# Patient Record
Sex: Female | Born: 2000 | Race: White | Hispanic: No | Marital: Single | Smoking: Former smoker
Health system: Southern US, Community
[De-identification: ages and names within clinical notes are randomized; demographics above are authoritative.]

---
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
## 2020-11-03 ENCOUNTER — Encounter: Payer: Self-pay | Admitting: Plastic Surgery

## 2020-11-03 ENCOUNTER — Ambulatory Visit: Payer: BC Managed Care – PPO | Admitting: Plastic Surgery

## 2020-11-03 ENCOUNTER — Other Ambulatory Visit: Payer: Self-pay

## 2020-11-03 DIAGNOSIS — M542 Cervicalgia: Secondary | ICD-10-CM

## 2020-11-03 DIAGNOSIS — M546 Pain in thoracic spine: Secondary | ICD-10-CM

## 2020-11-03 DIAGNOSIS — M549 Dorsalgia, unspecified: Secondary | ICD-10-CM | POA: Insufficient documentation

## 2020-11-03 DIAGNOSIS — N62 Hypertrophy of breast: Secondary | ICD-10-CM

## 2020-11-03 DIAGNOSIS — G8929 Other chronic pain: Secondary | ICD-10-CM

## 2020-11-03 NOTE — Progress Notes (Signed)
Patient ID: Lori Gonzalez, female    DOB: Jan 02, 2001, 20 y.o.   MRN: [id number]   Chief Complaint  Patient presents with   Advice Only    Mammary Hyperplasia: The patient is a 20 y.o. female with a history of mammary hyperplasia for several years.  She has extremely large breasts causing symptoms that include the following: Back pain in the upper and lower back, including neck pain. She pulls or pins her bra straps to provide better lift and relief of the pressure and pain. She notices relief by holding her breast up manually.  Her shoulder straps cause grooves and pain and pressure that requires padding for relief. Pain medication is sometimes required with motrin and tylenol.  Activities that are hindered by enlarged breasts include: exercise and running.  She has tried supportive clothing as well as fitted bras without improvement.  Her breasts are extremely large and fairly symmetric.  She has hyperpigmentation of the inframammary area on both sides.  The sternal to nipple distance on the right is 26 cm and the left is 27 cm.   She is 5 feet 3 inches tall and weighs 186 pounds.  The BMI = 32.9.  Preoperative bra size = 40 DDD cup. She would like to be ~ C cup.  The estimated excess breast tissue to be removed at the time of surgery = 490 grams on the left and 490 grams on the right.  Mammogram history: none.  Family history of breast cancer:  none.  Tobacco use:  quit one year ago.   The patient expresses the desire to pursue surgical intervention.    Review of Systems  Constitutional:  Positive for activity change. Negative for appetite change.  Eyes: Negative.   Respiratory: Negative.  Negative for chest tightness and shortness of breath.   Cardiovascular: Negative.   Gastrointestinal: Negative.   Endocrine: Negative.   Genitourinary: Negative.   Musculoskeletal:  Positive for back pain and neck pain.  Skin:  Positive for rash. Negative for color change.  Hematological:  Negative.   Psychiatric/Behavioral: Negative.     History reviewed. No pertinent past medical history.  History reviewed. No pertinent surgical history.    Current Outpatient Medications:    FLUoxetine (PROZAC) 20 MG capsule, Take 20 mg by mouth daily., Disp: , Rfl:    Objective:   Vitals:   11/03/20 1536  BP: 96/69  Pulse: 93  SpO2: 98%    Physical Exam Vitals and nursing note reviewed.  Constitutional:      Appearance: Normal appearance.  HENT:     Head: Normocephalic and atraumatic.  Cardiovascular:     Rate and Rhythm: Normal rate.     Pulses: Normal pulses.  Pulmonary:     Effort: Pulmonary effort is normal.  Abdominal:     General: Abdomen is flat. There is no distension.     Tenderness: There is no abdominal tenderness.  Skin:    General: Skin is warm.     Coloration: Skin is not jaundiced.     Findings: Lesion present. No bruising or erythema.  Neurological:     General: No focal deficit present.     Mental Status: She is alert and oriented to person, place, and time.  Psychiatric:        Mood and Affect: Mood normal.        Behavior: Behavior normal.        Thought Content: Thought content normal.    Assessment & Plan:  Chronic bilateral thoracic back pain  Neck pain  Symptomatic mammary hypertrophy  The procedure the patient selected and that was best for the patient was discussed. The risk were discussed and include but not limited to the following:  Breast asymmetry, fluid accumulation, firmness of the breast, inability to breast feed, loss of nipple or areola, skin loss, change in skin and nipple sensation, fat necrosis of the breast tissue, bleeding, infection and healing delay.  There are risks of anesthesia and injury to nerves or blood vessels.  Allergic reaction to tape, suture and skin glue are possible.  There will be swelling.  Any of these can lead to the need for revisional surgery.  A breast reduction has potential to interfere with  diagnostic procedures in the future.  This procedure is best done when the breast is fully developed.  Changes in the breast will continue to occur over time: pregnancy, weight gain or weigh loss.    Total time: 40 minutes. This includes time spent with the patient during the visit as well as time spent before and after the visit reviewing the chart, documenting the encounter and ordering pertinent studies. and literature emailed to the patient.   Physical therapy:  done and requesting notes Mammogram:  not indicated  Pictures were obtained of the patient and placed in the chart with the patient's or guardian's permission.  The patient is a good candidate for bilateral breast reduction with possible lateral liposuction.  She is thinking that she would like to do this in August or September of next year.  She knows to call us 90 days before she wants to do it and then we will submit to her insurance.   Alena Bills Ronan Dion, DO

## 2021-02-08 IMAGING — DX DG ANKLE COMPLETE 3+V*R*
3 series · 3 of 3 positions shown · non-contrast
Comparison: None.

CLINICAL DATA: Right ankle pain x 6-7 weeks. NKI.

EXAM:
RIGHT ANKLE - COMPLETE 3+ VIEW

[dg ankle complete right (1 of 3)]
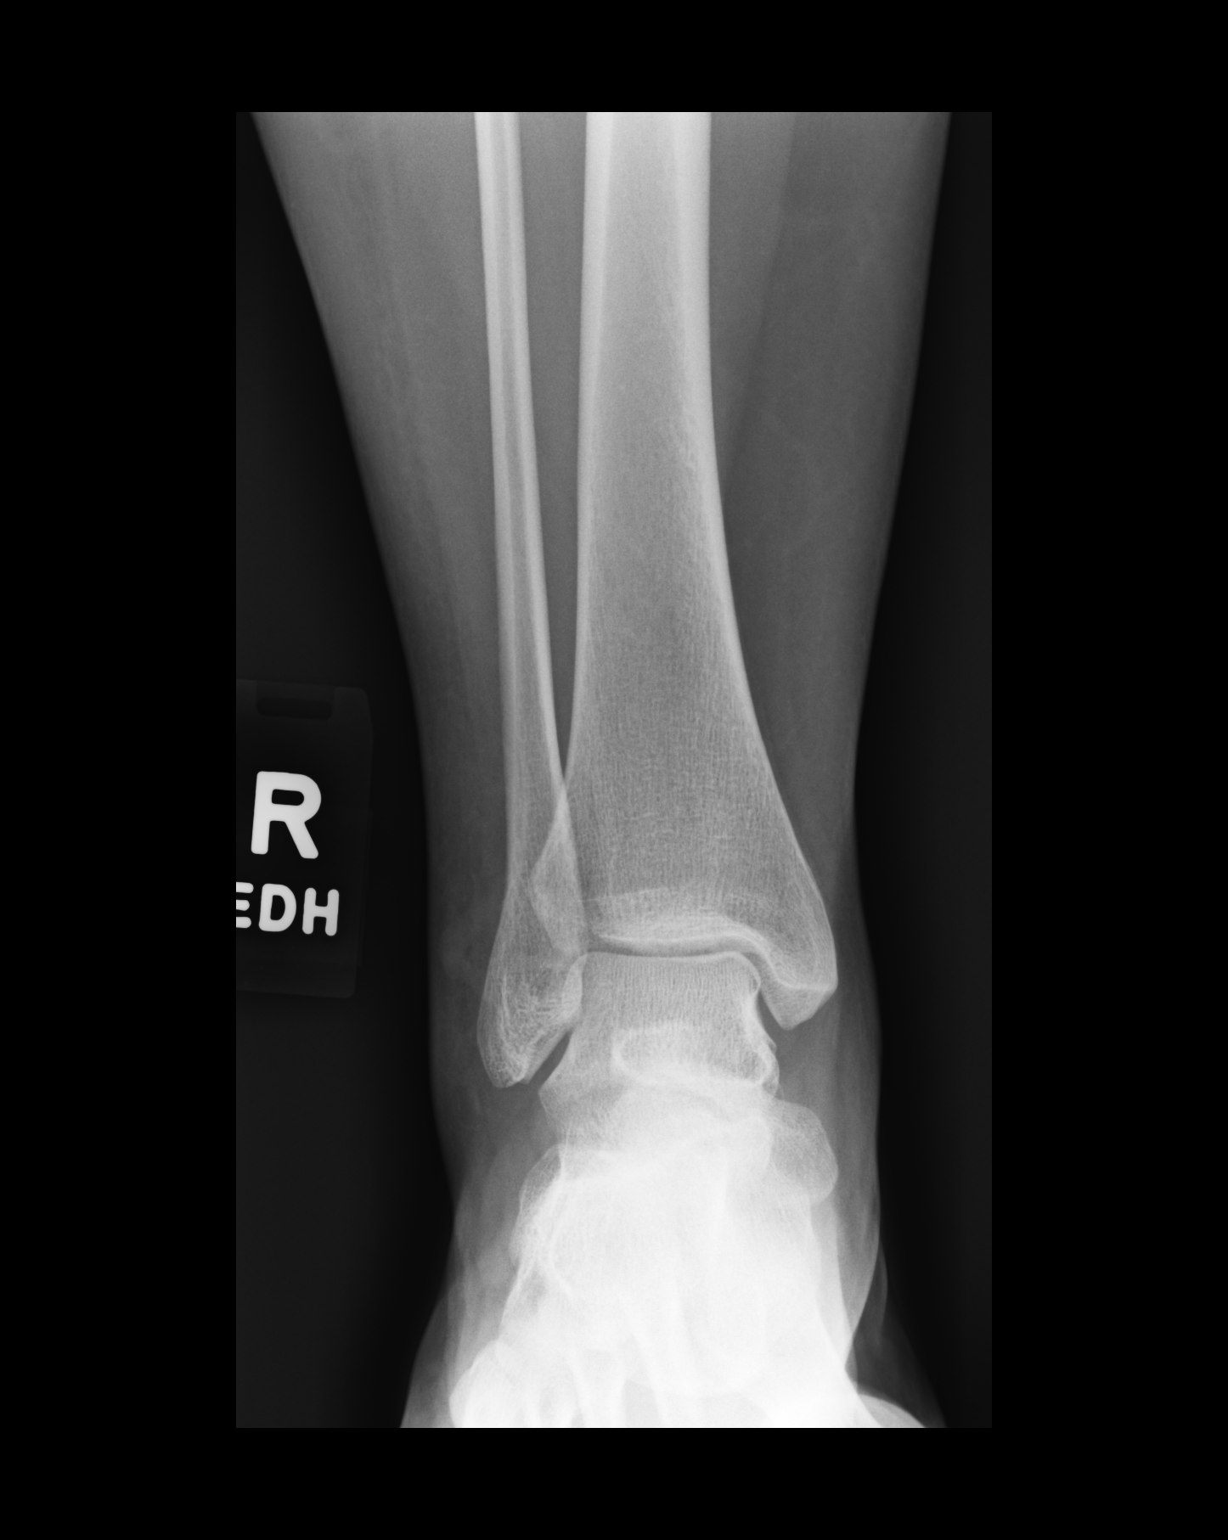

[dg ankle complete right (2 of 3)]
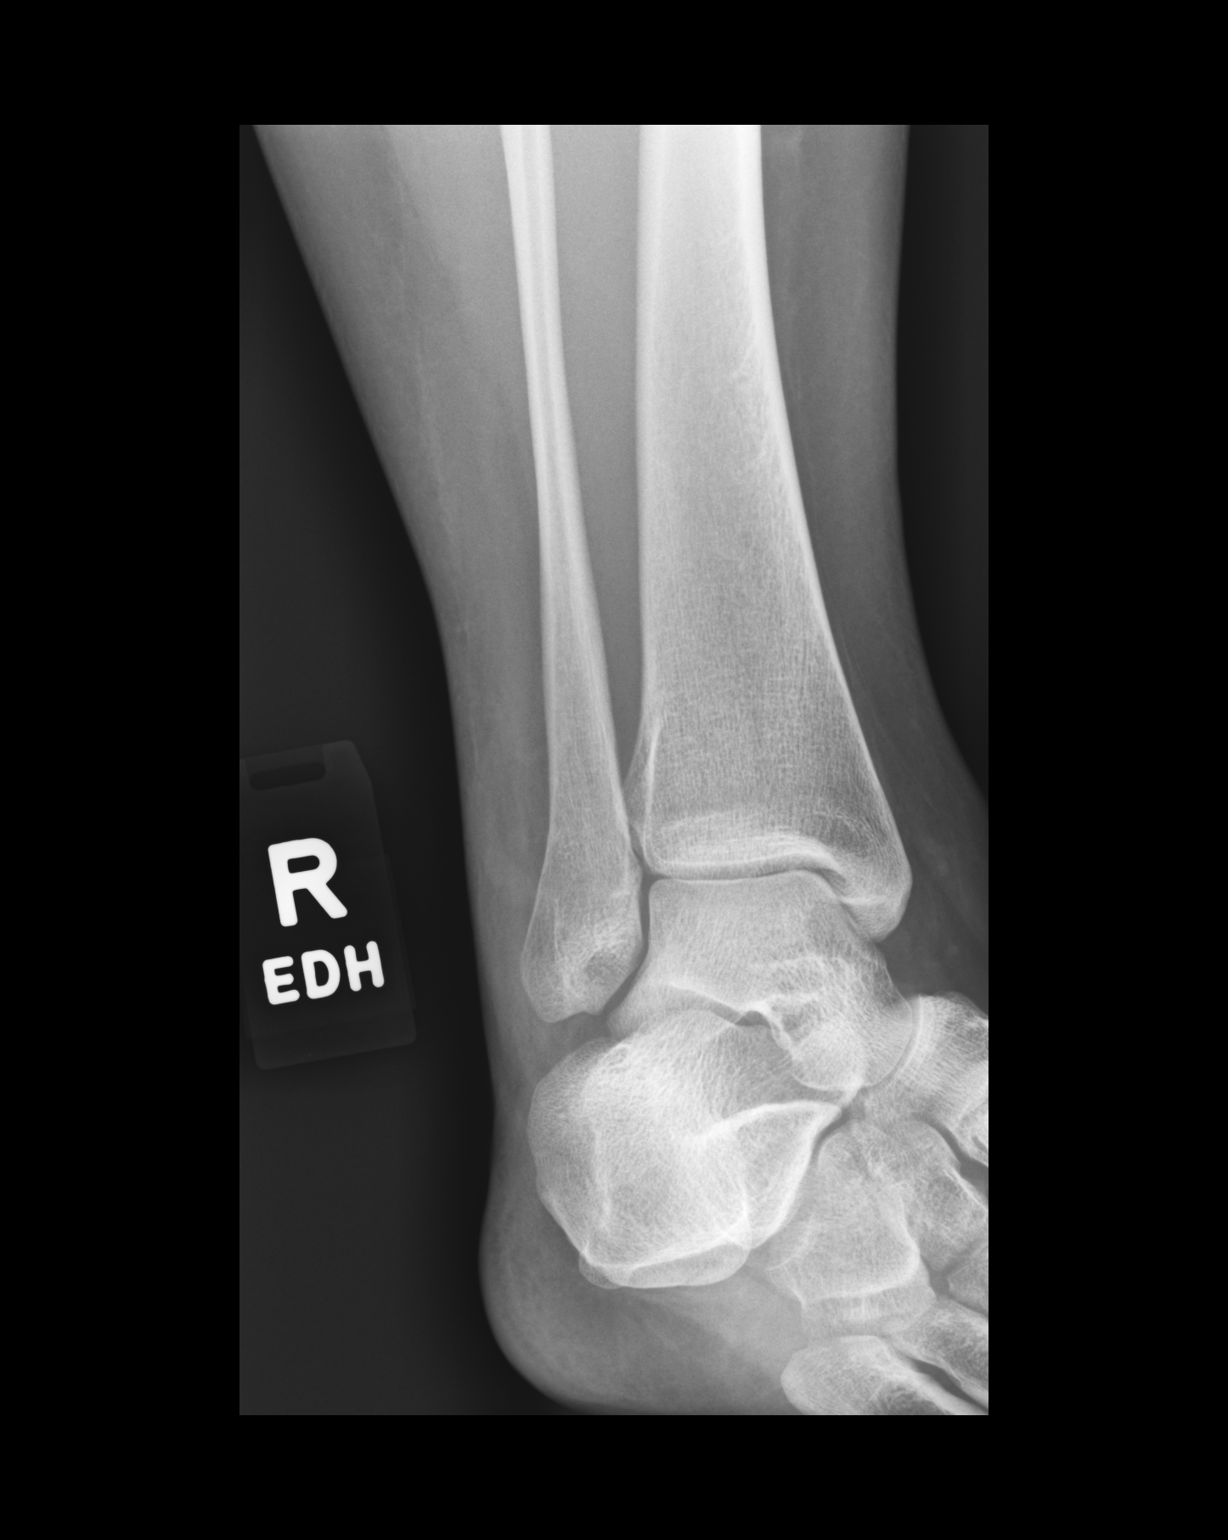

[dg ankle complete right (3 of 3)]
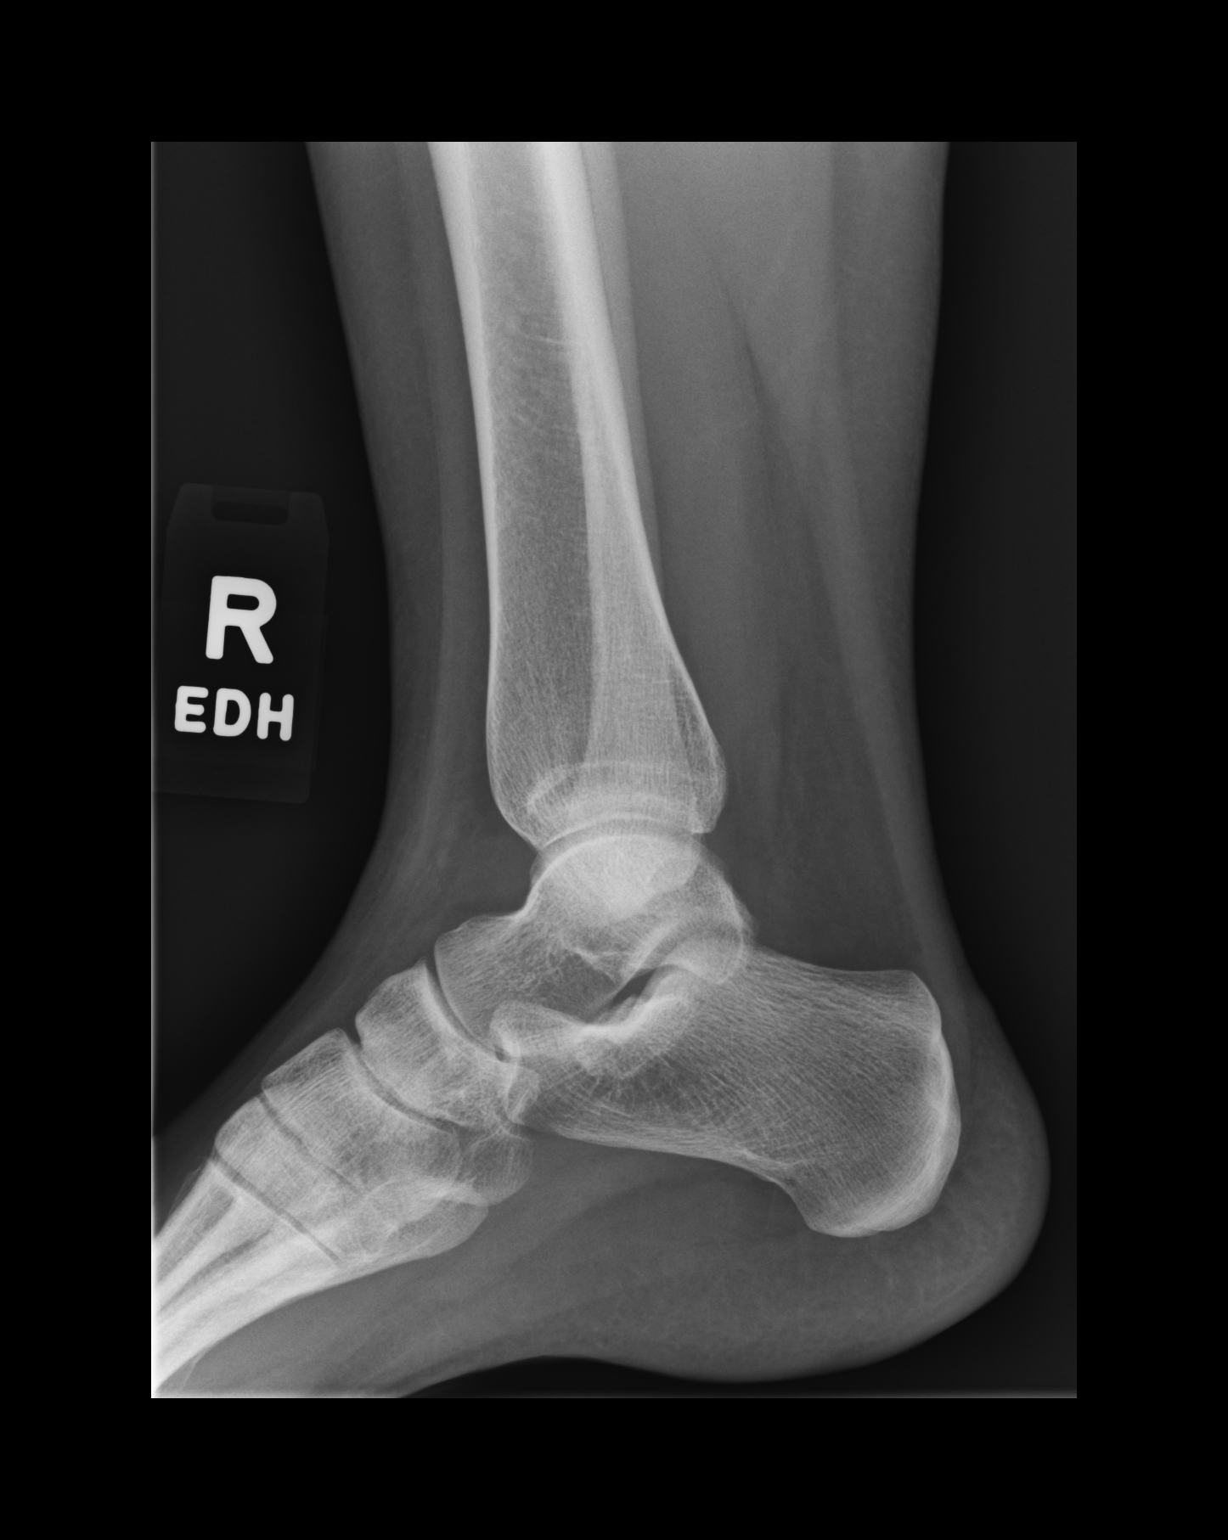

[3 of 3 positions shown; findings below may reference images not displayed]

FINDINGS: No acute fracture or dislocation. There is no evidence of
arthropathy or other focal bone abnormality. Soft tissues are
unremarkable.
IMPRESSION: Negative radiographs of the right ankle.
